# Patient Record
Sex: Male | Born: 2000 | Race: White | Hispanic: No | Marital: Single | Smoking: Never smoker
Health system: Southern US, Community
[De-identification: ages and names within clinical notes are randomized; demographics above are authoritative.]

---
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
## 2022-02-27 IMAGING — DX DG KNEE AP/LAT W/ SUNRISE*L*
1 series · 1 of 1 positions shown · non-contrast
Comparison: Same-day radiograph

CLINICAL DATA: Additional views to assess for patellar dislocation

EXAM:
LEFT KNEE 3 VIEWS

[patella skyline]
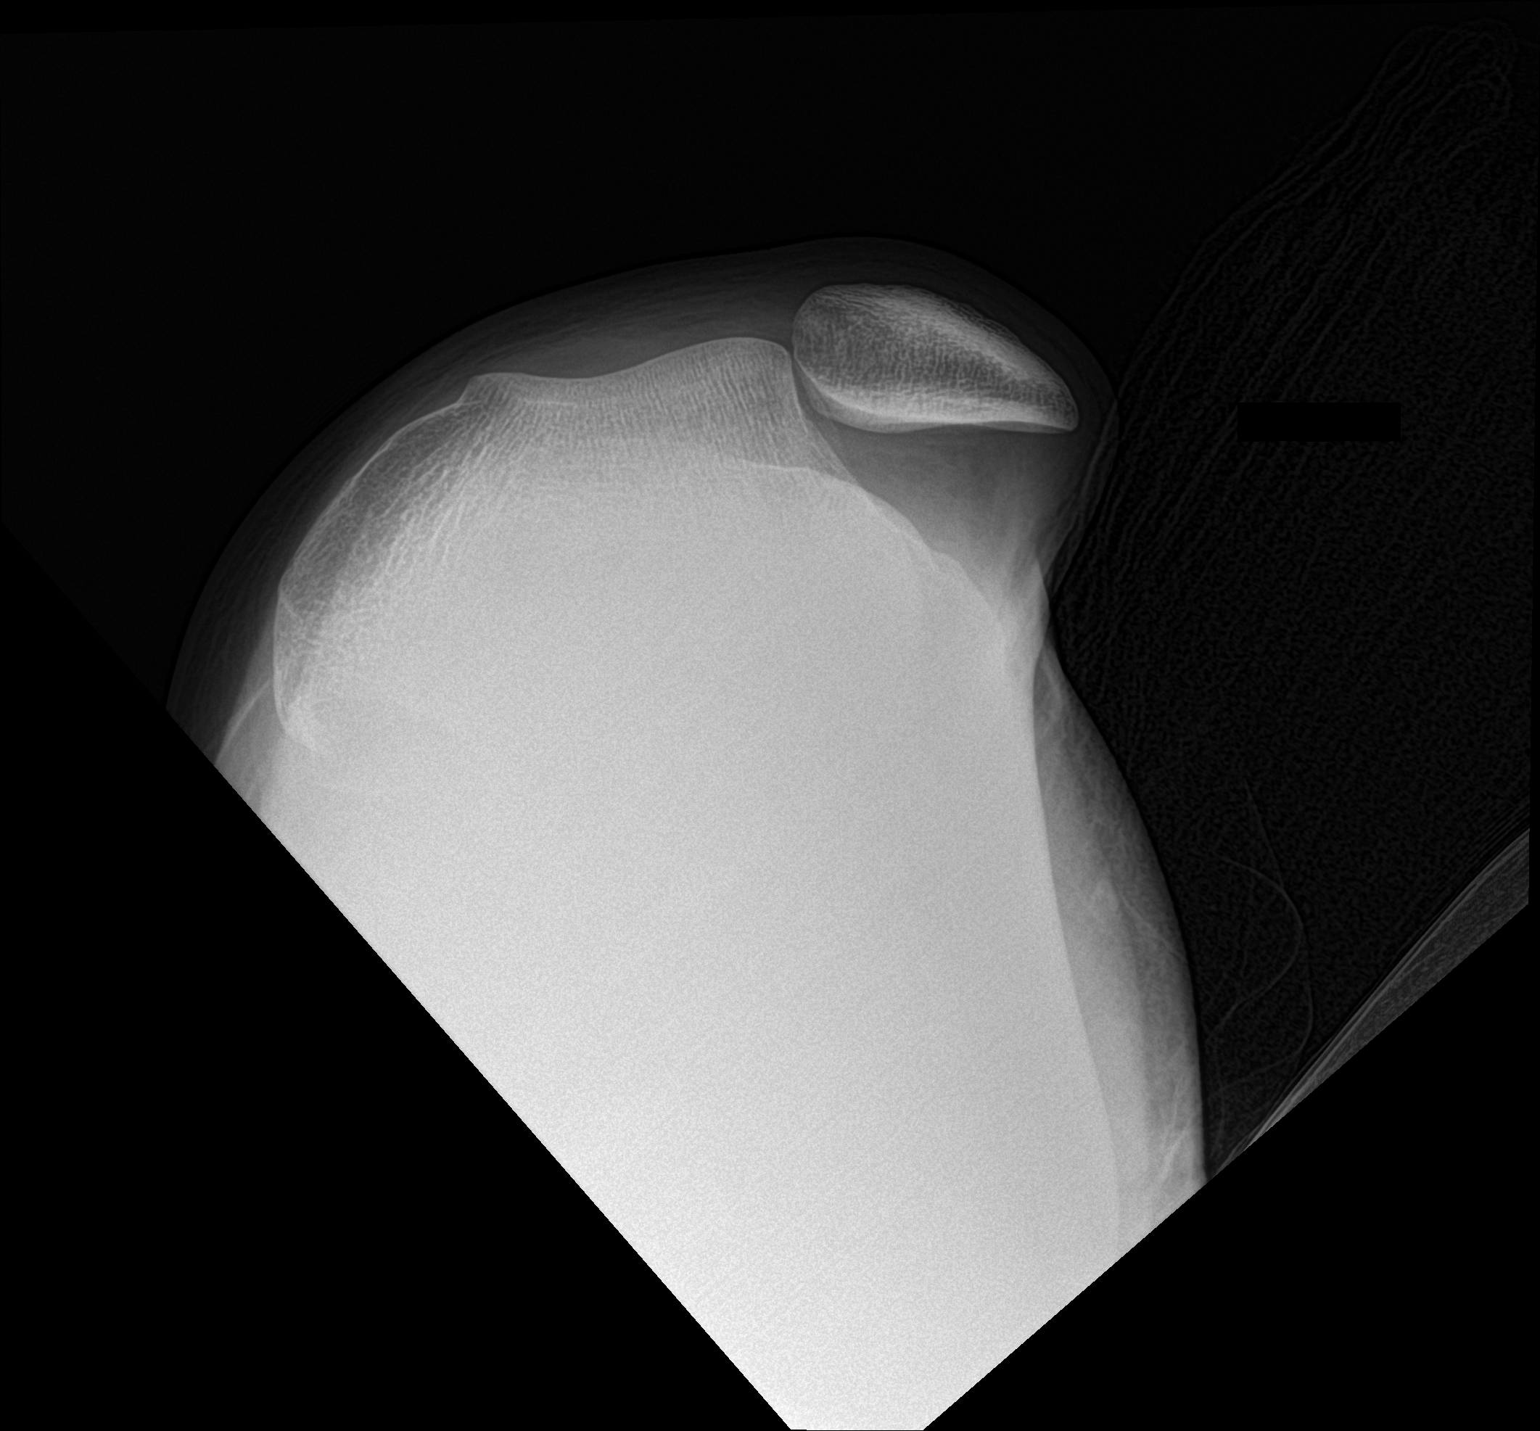

[1 of 1 positions shown; findings below may reference images not displayed]

FINDINGS: The patella is laterally dislocated. A shallow appearance of the
femoral trochlear groove may predispose this patient to patellar
dislocation. There is associated soft tissue swelling and probable
effusion.
IMPRESSION: Lateral dislocation of the patella.

A shallow appearance of the femoral trochlea may predispose this
patient to recurrent patellar dislocation.

## 2022-02-27 IMAGING — DX DG KNEE COMPLETE 4+V*L*
4 series · 4 of 4 positions shown · non-contrast
Comparison: None.

CLINICAL DATA: Status post trauma.

EXAM:
LEFT KNEE - COMPLETE 4+ VIEW

[knee ap]
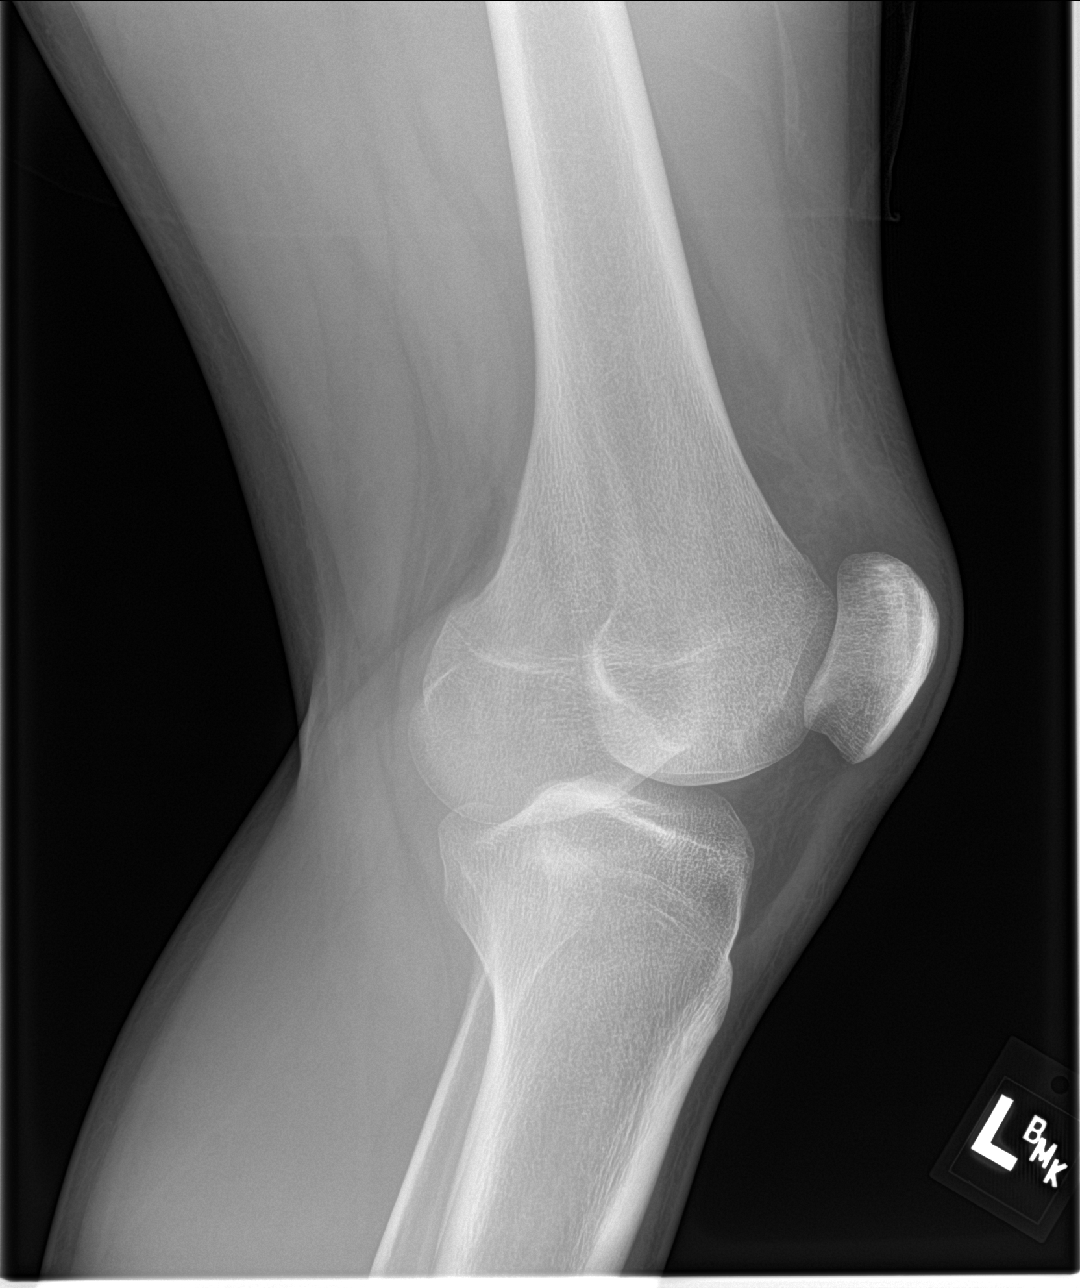

[knee tunnel]
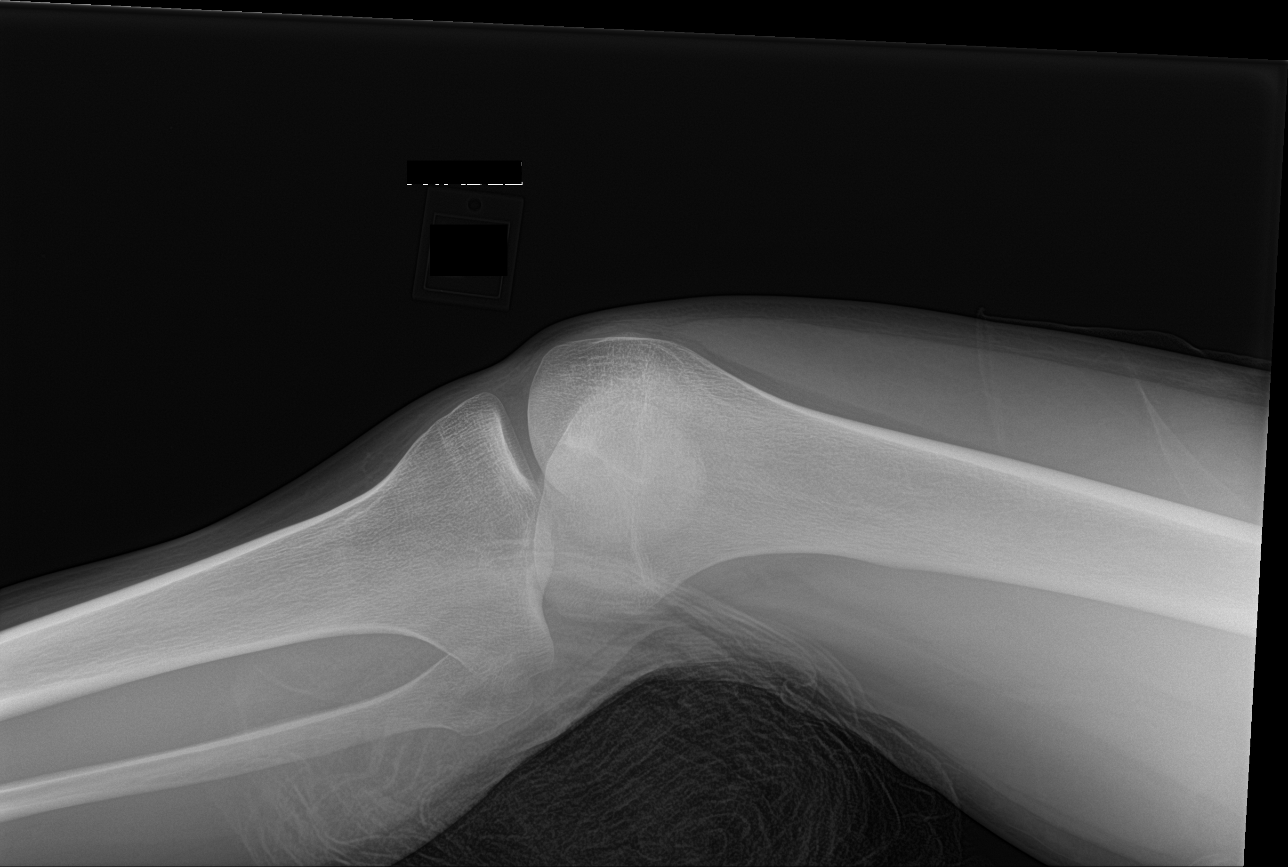

[knee lat]
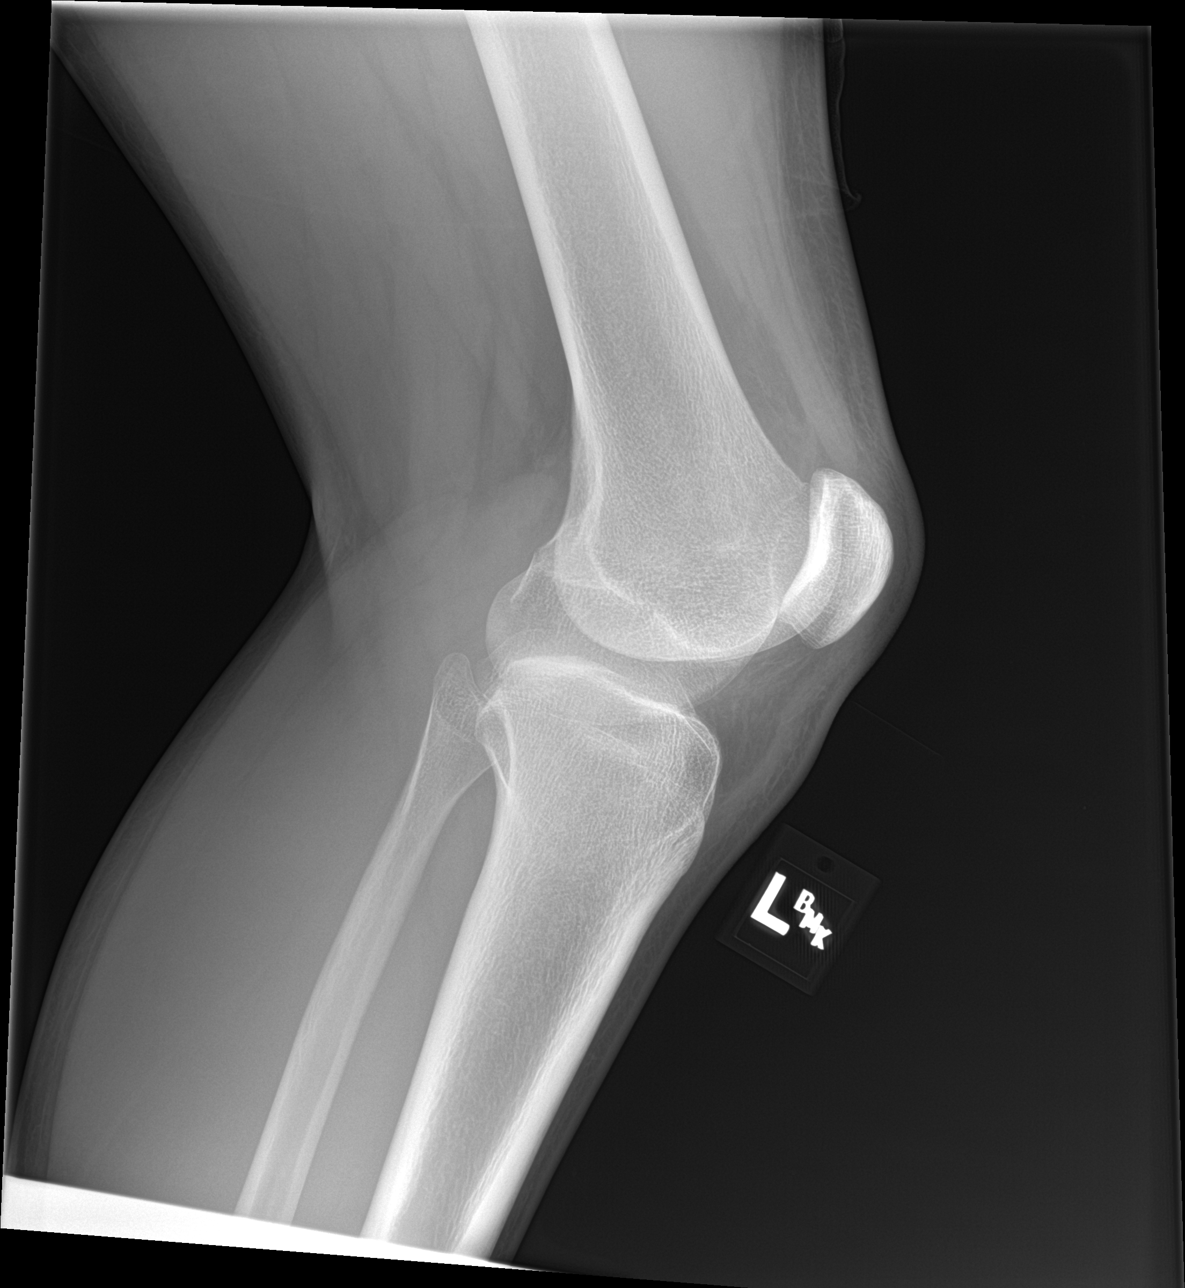

[knee obl]
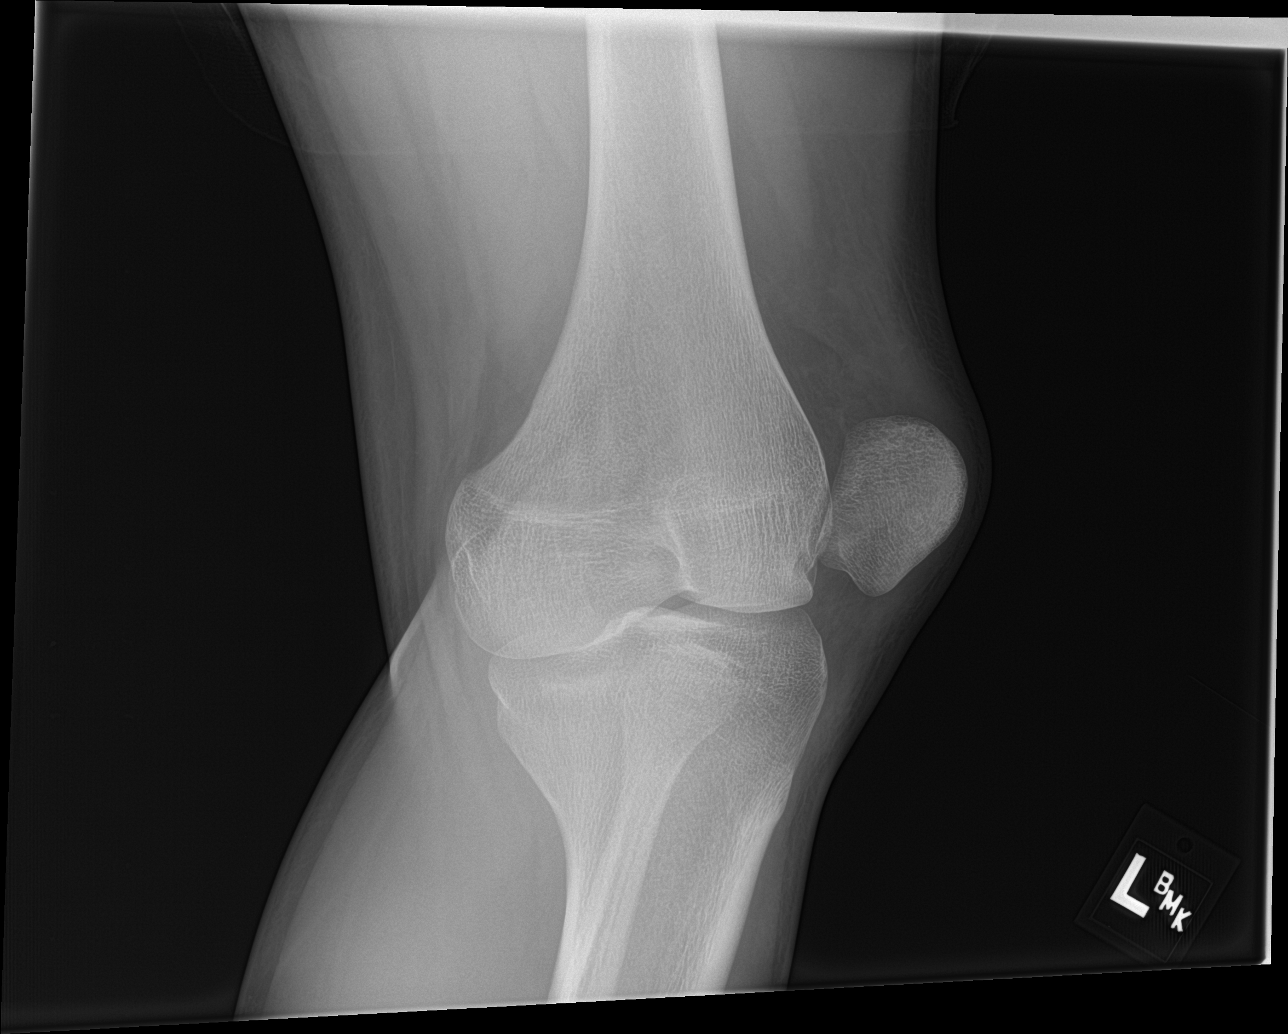

[4 of 4 positions shown; findings below may reference images not displayed]

FINDINGS: No evidence of acute fracture. There is no definite evidence of
patellar dislocation, however, a true frontal view and sunrise views
of the left knee were not submitted. No evidence of arthropathy or
other focal bone abnormality. A very small joint effusion is noted.
IMPRESSION: 1. No acute osseous abnormality.
2. Very small joint effusion.
3. Further evaluation with sunrise views is recommended to further
evaluate for the presence of patellar dislocation.
